# Patient Record
Sex: Male | Born: 1945 | Race: White | Hispanic: No | Marital: Married | Smoking: Never smoker
Health system: Southern US, Community
[De-identification: ages and names within clinical notes are randomized; demographics above are authoritative.]

## PROBLEM LIST (undated history)

## (undated) DIAGNOSIS — R42 Dizziness and giddiness: Secondary | ICD-10-CM

## (undated) DIAGNOSIS — E079 Disorder of thyroid, unspecified: Secondary | ICD-10-CM

## (undated) DIAGNOSIS — E119 Type 2 diabetes mellitus without complications: Secondary | ICD-10-CM

## (undated) DIAGNOSIS — I10 Essential (primary) hypertension: Secondary | ICD-10-CM

## (undated) HISTORY — PX: ANKLE SURGERY: SHX546

---

## 1999-01-09 ENCOUNTER — Encounter: Admission: RE | Admit: 1999-01-09 | Discharge: 1999-04-09 | Payer: Self-pay | Admitting: Endocrinology

## 2020-03-07 ENCOUNTER — Encounter (HOSPITAL_BASED_OUTPATIENT_CLINIC_OR_DEPARTMENT_OTHER): Payer: Self-pay | Admitting: *Deleted

## 2020-03-07 ENCOUNTER — Emergency Department (HOSPITAL_BASED_OUTPATIENT_CLINIC_OR_DEPARTMENT_OTHER)
Admission: EM | Admit: 2020-03-07 | Discharge: 2020-03-07 | Disposition: A | Discharge status: Home / Self Care | Payer: Medicare Other | Attending: Emergency Medicine | Admitting: Emergency Medicine

## 2020-03-07 ENCOUNTER — Other Ambulatory Visit: Payer: Self-pay

## 2020-03-07 ENCOUNTER — Emergency Department (HOSPITAL_BASED_OUTPATIENT_CLINIC_OR_DEPARTMENT_OTHER): Payer: Medicare Other

## 2020-03-07 DIAGNOSIS — E119 Type 2 diabetes mellitus without complications: Secondary | ICD-10-CM | POA: Insufficient documentation

## 2020-03-07 DIAGNOSIS — E860 Dehydration: Secondary | ICD-10-CM | POA: Diagnosis not present

## 2020-03-07 DIAGNOSIS — I10 Essential (primary) hypertension: Secondary | ICD-10-CM | POA: Diagnosis not present

## 2020-03-07 DIAGNOSIS — R531 Weakness: Secondary | ICD-10-CM | POA: Insufficient documentation

## 2020-03-07 HISTORY — DX: Essential (primary) hypertension: I10

## 2020-03-07 HISTORY — DX: Dizziness and giddiness: R42

## 2020-03-07 HISTORY — DX: Type 2 diabetes mellitus without complications: E11.9

## 2020-03-07 HISTORY — DX: Disorder of thyroid, unspecified: E07.9

## 2020-03-07 LAB — COMPREHENSIVE METABOLIC PANEL
ALT: 38 U/L (ref 0–44)
AST: 37 U/L (ref 15–41)
Albumin: 3.7 g/dL (ref 3.5–5.0)
Alkaline Phosphatase: 60 U/L (ref 38–126)
Anion gap: 11 (ref 5–15)
BUN: 24 mg/dL — ABNORMAL HIGH (ref 8–23)
CO2: 22 mmol/L (ref 22–32)
Calcium: 9 mg/dL (ref 8.9–10.3)
Chloride: 100 mmol/L (ref 98–111)
Creatinine, Ser: 2.07 mg/dL — ABNORMAL HIGH (ref 0.61–1.24)
GFR calc Af Amer: 35 mL/min — ABNORMAL LOW (ref >60)
GFR calc non Af Amer: 30 mL/min — ABNORMAL LOW (ref >60)
Glucose, Bld: 199 mg/dL — ABNORMAL HIGH (ref 70–99)
Potassium: 4.5 mmol/L (ref 3.5–5.1)
Sodium: 133 mmol/L — ABNORMAL LOW (ref 135–145)
Total Bilirubin: 0.7 mg/dL (ref 0.3–1.2)
Total Protein: 7 g/dL (ref 6.5–8.1)

## 2020-03-07 LAB — CBC WITH DIFFERENTIAL/PLATELET
Abs Immature Granulocytes: 0.01 10*3/uL (ref 0.00–0.07)
Basophils Absolute: 0 10*3/uL (ref 0.0–0.1)
Basophils Relative: 1 %
Eosinophils Absolute: 0.3 10*3/uL (ref 0.0–0.5)
Eosinophils Relative: 4 %
HCT: 35.2 % — ABNORMAL LOW (ref 39.0–52.0)
Hemoglobin: 11.5 g/dL — ABNORMAL LOW (ref 13.0–17.0)
Immature Granulocytes: 0 %
Lymphocytes Relative: 36 %
Lymphs Abs: 2.6 10*3/uL (ref 0.7–4.0)
MCH: 30.8 pg (ref 26.0–34.0)
MCHC: 32.7 g/dL (ref 30.0–36.0)
MCV: 94.4 fL (ref 80.0–100.0)
Monocytes Absolute: 0.6 10*3/uL (ref 0.1–1.0)
Monocytes Relative: 8 %
Neutro Abs: 3.7 10*3/uL (ref 1.7–7.7)
Neutrophils Relative %: 51 %
Platelets: 214 10*3/uL (ref 150–400)
RBC: 3.73 MIL/uL — ABNORMAL LOW (ref 4.22–5.81)
RDW: 12.8 % (ref 11.5–15.5)
WBC: 7.3 10*3/uL (ref 4.0–10.5)
nRBC: 0 % (ref 0.0–0.2)

## 2020-03-07 LAB — URINALYSIS, MICROSCOPIC (REFLEX)

## 2020-03-07 LAB — URINALYSIS, ROUTINE W REFLEX MICROSCOPIC
Bilirubin Urine: NEGATIVE
Glucose, UA: NEGATIVE mg/dL
Hgb urine dipstick: NEGATIVE
Ketones, ur: 15 mg/dL — AB
Nitrite: NEGATIVE
Protein, ur: NEGATIVE mg/dL
Specific Gravity, Urine: >1.03 — ABNORMAL HIGH (ref 1.005–1.030)
pH: 5.5 (ref 5.0–8.0)

## 2020-03-07 LAB — LIPASE, BLOOD: Lipase: 24 U/L (ref 11–51)

## 2020-03-07 LAB — CBG MONITORING, ED: Glucose-Capillary: 191 mg/dL — ABNORMAL HIGH (ref 70–99)

## 2020-03-07 MED ORDER — SODIUM CHLORIDE 0.9 % IV BOLUS
500.0000 mL | Freq: Once | INTRAVENOUS | Status: AC
  Administered 2020-03-07: 500 mL via INTRAVENOUS

## 2020-03-07 NOTE — ED Triage Notes (Signed)
Dizziness, diarrhea, fever, weakness. States he had a Covid vaccine a month ago.

## 2020-03-07 NOTE — Discharge Instructions (Addendum)
You were seen in the emergency department today with mild dehydration and elevated kidney function labs.  Your creatinine today was 2.07.  This may be from dehydration but you need close follow-up with your primary care doctor.  They need to repeat this chemistry/creatinine lab in the next 48 hours to make sure it has improved.  Please drink plenty of hydrating fluids such as water or electrolyte drinks that are low in sugar.  Avoid coffee and soda.  I will also have you temporarily stop taking your lisinopril as this can cause kidney injury.  Please make your primary doctor aware that we are stopping this medicine temporarily and decide if they want to start you back on it or select an alternative medicine for your blood pressure.   If you stop urinating, develop pain, worsening diarrhea, weakness, lightheadedness you should return to the emergency department immediately.

## 2020-03-07 NOTE — ED Provider Notes (Signed)
Emergency Department Provider Note   I have reviewed the triage vital signs and the nursing notes.   HISTORY  Chief Complaint Weakness   HPI Jay Baldwin is a 74 y.o. male with past medical history reviewed below presents to the emergency department with acute onset diarrhea symptoms this morning.  He has had some associated generalized weakness over the past 2 weeks.  He describes this as feeling lightheaded intermittently especially with standing.  Denies any chest pain, palpitations, shortness of breath.  He had an episode this morning where he took his home medications and went to work.  He suddenly had the sense of needing to have immediate diarrhea without significant pain.  He felt some abdominal cramping.  He was able to have diarrhea and immediately felt better.  He denies any blood in his bowel movements.  His bowel movements are consistently dark as he is on iron supplementation.  He reports occasional dysuria but nothing consistent.  No fevers or chills.  No active abdominal pain or back pain.  No radiation of symptoms or other modifying factors.   Past Medical History:  Diagnosis Date  . Diabetes mellitus without complication (HCC)   . Dizziness   . Hypertension   . Thyroid disease     There are no problems to display for this patient.   Past Surgical History:  Procedure Laterality Date  . ANKLE SURGERY      Allergies Patient has no known allergies.  No family history on file.  Social History Social History   Tobacco Use  . Smoking status: Never Smoker  . Smokeless tobacco: Never Used  Substance Use Topics  . Alcohol use: Never  . Drug use: Never    Review of Systems  Constitutional: No fever/chills. Positive intermittent dizziness.  Eyes: No visual changes. ENT: No sore throat. Cardiovascular: Denies chest pain. Respiratory: Denies shortness of breath. Gastrointestinal: No abdominal pain.  No nausea, no vomiting. Positive diarrhea this  AM x 1.  No constipation. Genitourinary: Negative for dysuria. Musculoskeletal: Negative for back pain. Skin: Negative for rash. Neurological: Negative for headaches, focal weakness or numbness.  10-point ROS otherwise negative.  ____________________________________________   PHYSICAL EXAM:  VITAL SIGNS: ED Triage Vitals  Enc Vitals Group     BP 03/07/20 1709 (!) 111/93     Pulse Rate 03/07/20 1709 61     Resp 03/07/20 1709 20     Temp 03/07/20 1709 98 F (36.7 C)     Temp Source 03/07/20 1709 Oral     SpO2 03/07/20 1709 99 %     Weight 03/07/20 1703 200 lb (90.7 kg)     Height 03/07/20 1703 6' (1.829 m)   Constitutional: Alert and oriented. Well appearing and in no acute distress. Eyes: Conjunctivae are normal.  Head: Atraumatic. Nose: No congestion/rhinnorhea. Mouth/Throat: Mucous membranes are moist.  Neck: No stridor.  Cardiovascular: Normal rate, regular rhythm. Good peripheral circulation. Grossly normal heart sounds.   Respiratory: Normal respiratory effort.  No retractions. Lungs CTAB. Gastrointestinal: Soft and nontender. No distention.  Musculoskeletal: No lower extremity tenderness nor edema. No gross deformities of extremities. Neurologic:  Normal speech and language. No gross focal neurologic deficits are appreciated.  Skin:  Skin is warm, dry and intact. No rash noted.  ____________________________________________   LABS (all labs ordered are listed, but only abnormal results are displayed)  Labs Reviewed  COMPREHENSIVE METABOLIC PANEL - Abnormal; Notable for the following components:      Result Value  Sodium 133 (*)    Glucose, Bld 199 (*)    BUN 24 (*)    Creatinine, Ser 2.07 (*)    GFR calc non Af Amer 30 (*)    GFR calc Af Amer 35 (*)    All other components within normal limits  CBC WITH DIFFERENTIAL/PLATELET - Abnormal; Notable for the following components:   RBC 3.73 (*)    Hemoglobin 11.5 (*)    HCT 35.2 (*)    All other components  within normal limits  URINALYSIS, ROUTINE W REFLEX MICROSCOPIC - Abnormal; Notable for the following components:   Specific Gravity, Urine >1.030 (*)    Ketones, ur 15 (*)    Leukocytes,Ua TRACE (*)    All other components within normal limits  URINALYSIS, MICROSCOPIC (REFLEX) - Abnormal; Notable for the following components:   Bacteria, UA FEW (*)    All other components within normal limits  CBG MONITORING, ED - Abnormal; Notable for the following components:   Glucose-Capillary 191 (*)    All other components within normal limits  LIPASE, BLOOD   ____________________________________________  EKG   EKG Interpretation  Date/Time:  Tuesday March 07 2020 17:22:53 EDT Ventricular Rate:  61 PR Interval:    QRS Duration: 103 QT Interval:  418 QTC Calculation: 421 R Axis:   -12 Text Interpretation: Sinus rhythm Nonspecific T abnormalities, inferior leads No STEMI Confirmed by Nanda Quinton [phone]) on 03/07/2020 5:31:41 PM       ____________________________________________  RADIOLOGY  CT head reviewed with no acute findings.  ____________________________________________   PROCEDURES  Procedure(s) performed:   Procedures  None ____________________________________________   INITIAL IMPRESSION / ASSESSMENT AND PLAN / ED COURSE  Pertinent labs & imaging results that were available during my care of the patient were reviewed by me and considered in my medical decision making (see chart for details).   Patient presents to the emergency department with acute onset diarrhea with generalized weakness.  He is afebrile here.  There is mention of possible fever in his triage note but that is not accurate.  He appears reasonably well-hydrated.  His abdomen is completely soft and nontender to palpation.  His initial blood work shows no leukocytosis.  No evidence of urinary tract infection.    Chemistry with elevated creatinine which appears new. Discussed admit for IVF with patient  but AKI is borderline. He has a very close relationship with his PCP and can get labs repeated quickly. CT head ordered with dizzy feelings intermittently but no findings on exam to suspect CVA. CT head is normal for age.   Patient ambulatory in the ED without assistance. He is feeling improved after IVF. Agree with plan for repeat chemistry in 2 days after holding lisinopril and hydrating with fluid PO at home. Discussed strict return precautions with patient and wife at bedside.    ____________________________________________  FINAL CLINICAL IMPRESSION(S) / ED DIAGNOSES  Final diagnoses:  Generalized weakness  Dehydration    MEDICATIONS GIVEN DURING THIS VISIT:  Medications  sodium chloride 0.9 % bolus 500 mL (0 mLs Intravenous Stopped 03/07/20 1958)    Note:  This document was prepared using Dragon voice recognition software and may include unintentional dictation errors.  Nanda Quinton, MD, [facility] Emergency Medicine    Marky Buresh, [facility], MD 03/08/20 [phone]

## 2020-03-07 NOTE — ED Notes (Signed)
PT ambulated with steady gait and no assistance to the end of the nurses station. No complaints and O2 Sats 95% lowest, PT lied flat for orthostatic vitals.

## 2021-05-22 IMAGING — CT CT HEAD W/O CM
3 series · 15 of 47 positions shown, 18 images · non-contrast
Comparison: None.

CLINICAL DATA: Dizziness, diarrhea, fever and weakness.

EXAM:
CT HEAD WITHOUT CONTRAST
TECHNIQUE: Contiguous axial images were obtained from the base of the skull
through the vertex without intravenous contrast.

[Series 2: head wo · axial · 0.44mm/px · z∈[+910,+1036]mm · 9 of 31 slices shown, 12 images]
[im 3/31  brain]
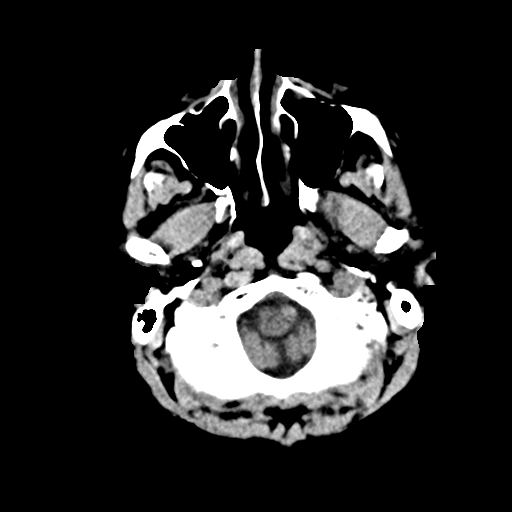
[im 3/31  bone]
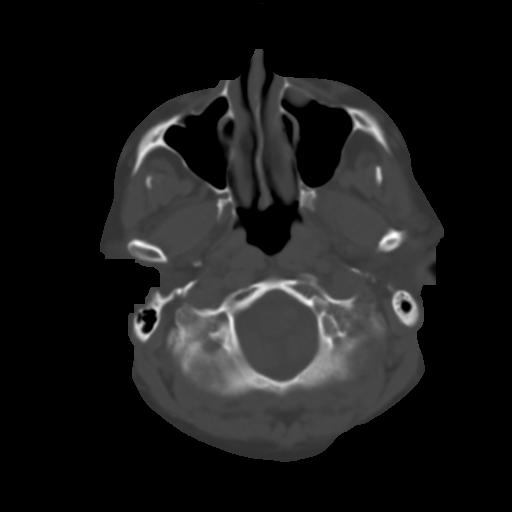
[im 6/31  brain]
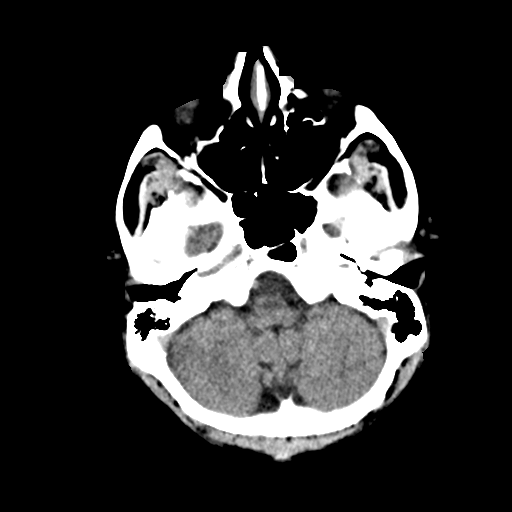
[im 9/31  brain]
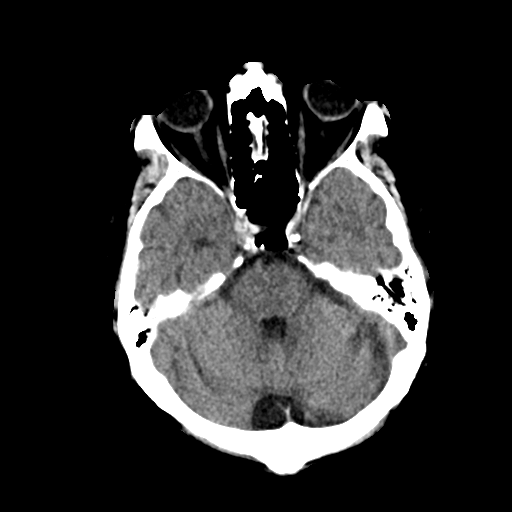
[im 12/31  brain]
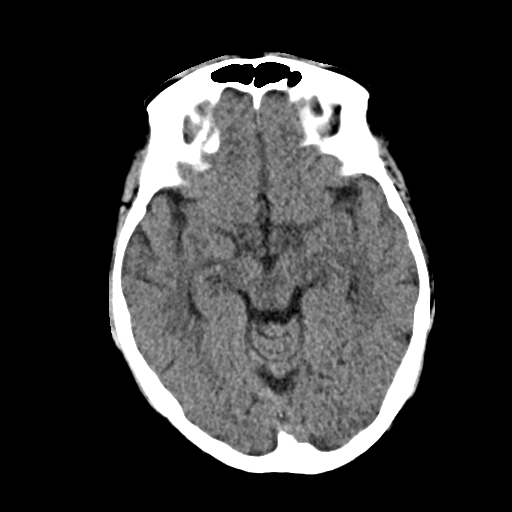
[im 16/31  brain]
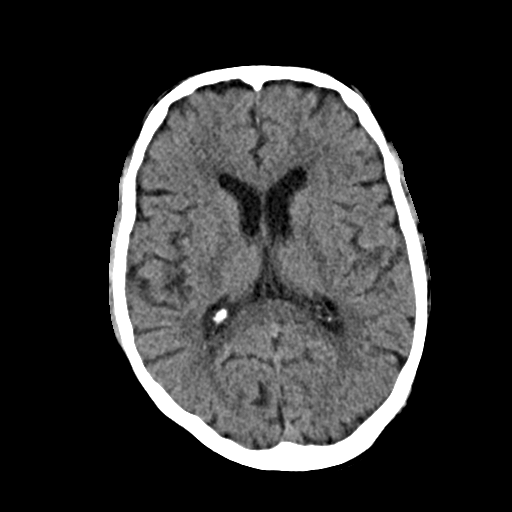
[im 16/31  bone]
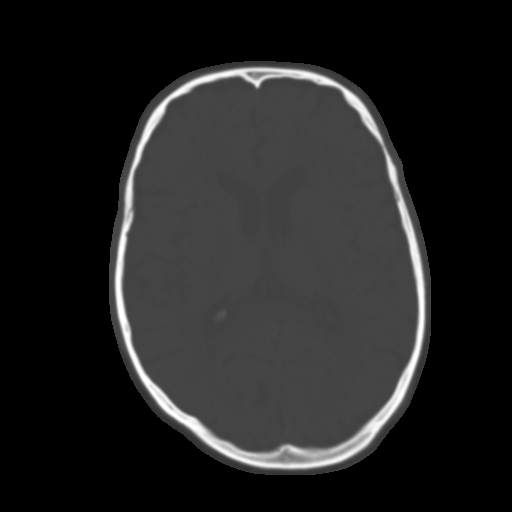
[im 19/31  brain]
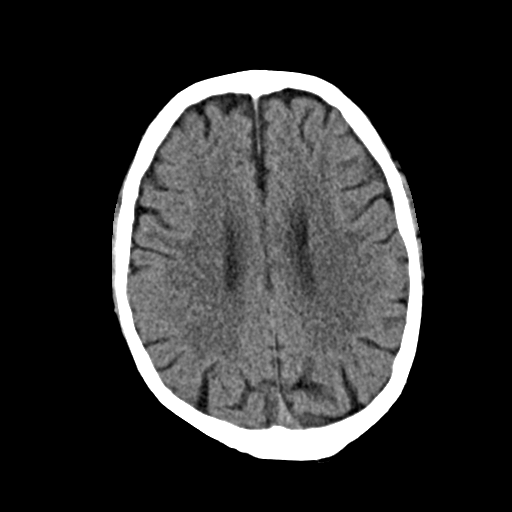
[im 22/31  brain]
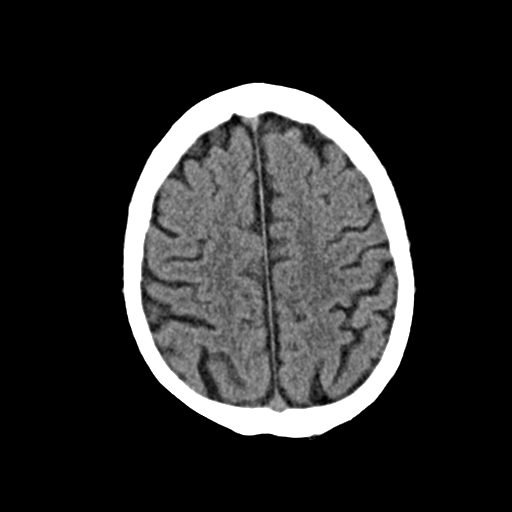
[im 25/31  brain]
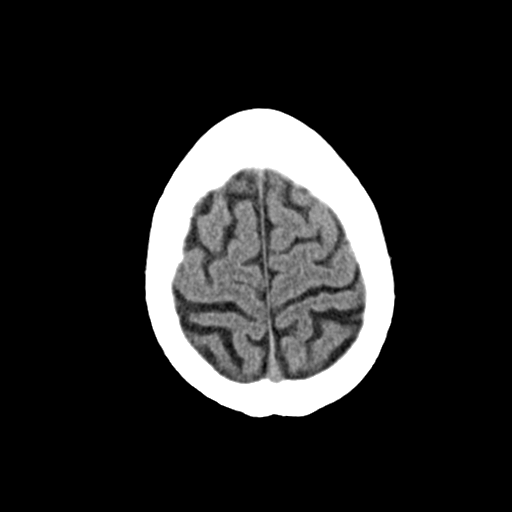
[im 28/31  brain]
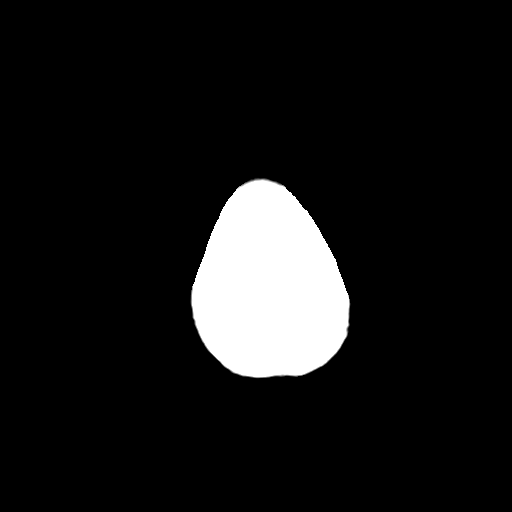
[im 28/31  bone]
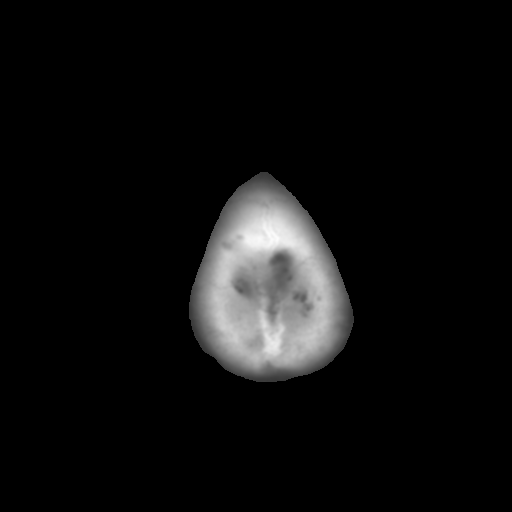

[Series 4: coronal soft · coronal · 0.31mm/px · 3 of 70 slices shown]
[im 24/70  brain]
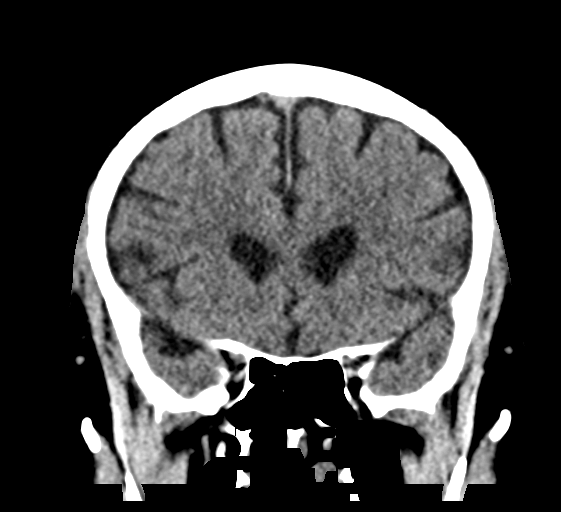
[im 31/70  brain]
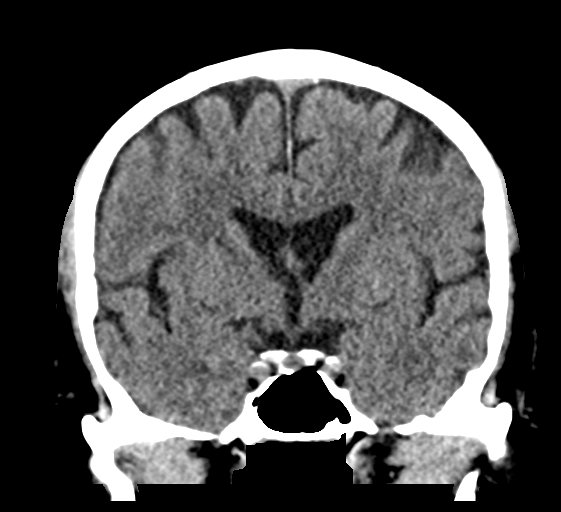
[im 39/70  brain]
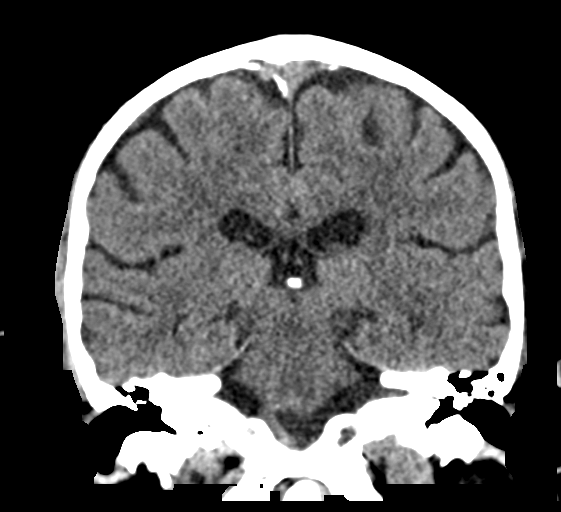

[Series 5: sag soft · sagittal · 0.29mm/px · 3 of 56 slices shown]
[im 19/56  brain]
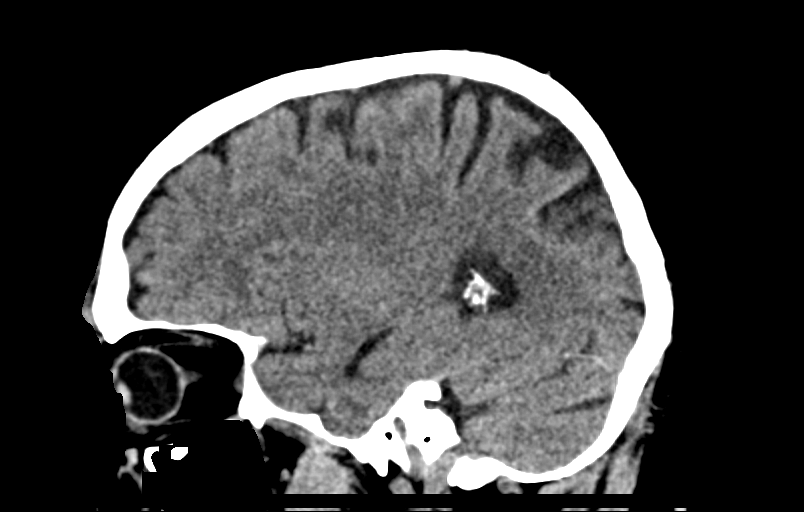
[im 28/56  brain]
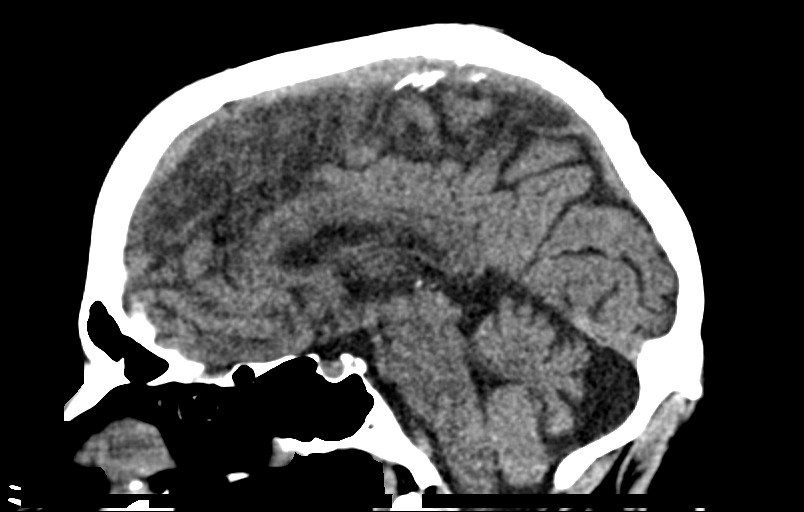
[im 37/56  brain]
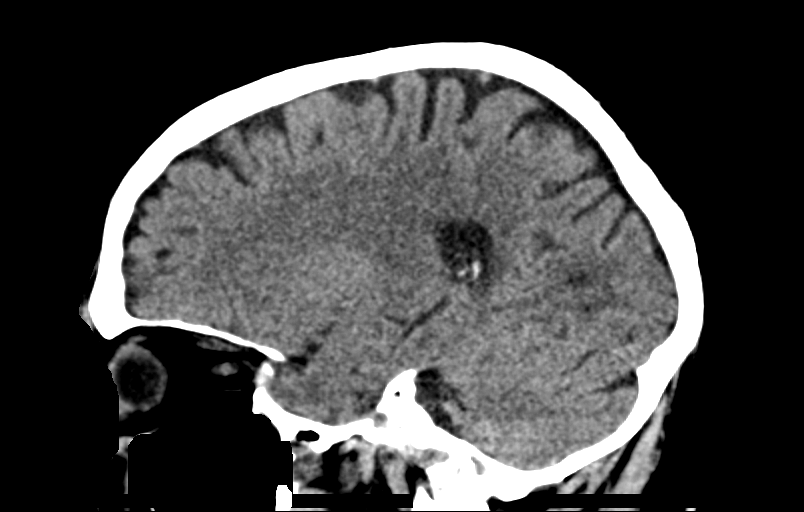

[15 of 47 positions shown; findings below may reference images not displayed]

FINDINGS: Brain: There is mild cerebral atrophy with widening of the
extra-axial spaces and ventricular dilatation.
There are areas of decreased attenuation within the white matter
tracts of the supratentorial brain, consistent with microvascular
disease changes.

Vascular: No hyperdense vessel or unexpected calcification.

Skull: Normal. Negative for fracture or focal lesion.

Sinuses/Orbits: A 0.7 cm anterior right maxillary sinus polyp versus
mucous retention cyst is seen. An additional 1.1 cm polyp versus
mucous retention cyst is seen within the anteromedial aspect of the
left maxillary sinus.

Other: None.
IMPRESSION: No acute intracranial pathology.

## 2023-11-29 ENCOUNTER — Encounter (HOSPITAL_BASED_OUTPATIENT_CLINIC_OR_DEPARTMENT_OTHER): Payer: Self-pay

## 2023-11-29 ENCOUNTER — Other Ambulatory Visit: Payer: Self-pay

## 2023-11-29 ENCOUNTER — Emergency Department (HOSPITAL_BASED_OUTPATIENT_CLINIC_OR_DEPARTMENT_OTHER)
Admission: EM | Admit: 2023-11-29 | Discharge: 2023-11-29 | Disposition: A | Discharge status: Home / Self Care | Payer: Medicare PPO | Attending: Emergency Medicine | Admitting: Emergency Medicine

## 2023-11-29 DIAGNOSIS — Z79899 Other long term (current) drug therapy: Secondary | ICD-10-CM | POA: Diagnosis not present

## 2023-11-29 DIAGNOSIS — E119 Type 2 diabetes mellitus without complications: Secondary | ICD-10-CM | POA: Diagnosis not present

## 2023-11-29 DIAGNOSIS — Z7982 Long term (current) use of aspirin: Secondary | ICD-10-CM | POA: Diagnosis not present

## 2023-11-29 DIAGNOSIS — R110 Nausea: Secondary | ICD-10-CM

## 2023-11-29 DIAGNOSIS — Z794 Long term (current) use of insulin: Secondary | ICD-10-CM | POA: Diagnosis not present

## 2023-11-29 DIAGNOSIS — I10 Essential (primary) hypertension: Secondary | ICD-10-CM | POA: Diagnosis not present

## 2023-11-29 DIAGNOSIS — R197 Diarrhea, unspecified: Secondary | ICD-10-CM

## 2023-11-29 DIAGNOSIS — Z7984 Long term (current) use of oral hypoglycemic drugs: Secondary | ICD-10-CM | POA: Diagnosis not present

## 2023-11-29 DIAGNOSIS — E860 Dehydration: Secondary | ICD-10-CM | POA: Diagnosis not present

## 2023-11-29 LAB — CBC
HCT: 35.7 % — ABNORMAL LOW (ref 39.0–52.0)
Hemoglobin: 11.9 g/dL — ABNORMAL LOW (ref 13.0–17.0)
MCH: 30.5 pg (ref 26.0–34.0)
MCHC: 33.3 g/dL (ref 30.0–36.0)
MCV: 91.5 fL (ref 80.0–100.0)
Platelets: 171 10*3/uL (ref 150–400)
RBC: 3.9 MIL/uL — ABNORMAL LOW (ref 4.22–5.81)
RDW: 12.8 % (ref 11.5–15.5)
WBC: 5.7 10*3/uL (ref 4.0–10.5)
nRBC: 0 % (ref 0.0–0.2)

## 2023-11-29 LAB — URINALYSIS, ROUTINE W REFLEX MICROSCOPIC
Bilirubin Urine: NEGATIVE
Glucose, UA: 100 mg/dL — AB
Hgb urine dipstick: NEGATIVE
Ketones, ur: NEGATIVE mg/dL
Leukocytes,Ua: NEGATIVE
Nitrite: NEGATIVE
Protein, ur: NEGATIVE mg/dL
Specific Gravity, Urine: 1.025 (ref 1.005–1.030)
pH: 5.5 (ref 5.0–8.0)

## 2023-11-29 LAB — COMPREHENSIVE METABOLIC PANEL
ALT: 23 U/L (ref 0–44)
AST: 26 U/L (ref 15–41)
Albumin: 3.5 g/dL (ref 3.5–5.0)
Alkaline Phosphatase: 53 U/L (ref 38–126)
Anion gap: 7 (ref 5–15)
BUN: 15 mg/dL (ref 8–23)
CO2: 23 mmol/L (ref 22–32)
Calcium: 9 mg/dL (ref 8.9–10.3)
Chloride: 104 mmol/L (ref 98–111)
Creatinine, Ser: 0.91 mg/dL (ref 0.61–1.24)
GFR, Estimated: >60 mL/min (ref >60)
Glucose, Bld: 173 mg/dL — ABNORMAL HIGH (ref 70–99)
Potassium: 4.3 mmol/L (ref 3.5–5.1)
Sodium: 134 mmol/L — ABNORMAL LOW (ref 135–145)
Total Bilirubin: 1.1 mg/dL (ref <1.2)
Total Protein: 6.7 g/dL (ref 6.5–8.1)

## 2023-11-29 LAB — LIPASE, BLOOD: Lipase: 24 U/L (ref 11–51)

## 2023-11-29 MED ORDER — ONDANSETRON 4 MG PO TBDP: 4.0000 mg | ORAL_TABLET | Freq: Three times a day (TID) | ORAL | 0 refills | Status: AC | PRN

## 2023-11-29 NOTE — ED Provider Notes (Signed)
[place] [facility] DEPARTMENT AT [facility] Provider Note   CSN: [id number] Arrival date & time: 11/29/23  0945     History  Chief Complaint  Patient presents with   Diarrhea    Jay Baldwin is a 77 y.o. male.  The history is provided by the patient, medical records and the spouse. No language interpreter was used.  Diarrhea Quality:  Watery Severity:  Moderate Onset quality:  Gradual Duration:  1 week Timing:  Constant Progression:  Unchanged Relieved by:  Nothing Worsened by:  Nothing Ineffective treatments:  None tried Associated symptoms: no abdominal pain, no chills, no recent cough, no diaphoresis, no fever, no headaches, no URI and no vomiting   Risk factors: no recent antibiotic use   Risk factors comment:  Started iron      Home Medications Prior to Admission medications   Medication Sig Start Date End Date Taking? Authorizing Provider  aspirin 81 MG EC tablet Take 1 tablet daily    [provider]  escitalopram (LEXAPRO) 20 MG tablet TAKE 1 TABLET BY MOUTH DAILY 02/02/14   [provider]  ezetimibe (ZETIA) 10 MG tablet Take by mouth. 07/14/19   [provider]  gabapentin (NEURONTIN) 300 MG capsule TAKE THREE (3) CAPSULES BY MOUTH TWO (2) TIMES DAILY 08/21/16   [provider]  glucose blood (ONETOUCH ULTRA) test strip USE ONE TEST STRIP TO CHECK BLOOD SUGAR THREE TIMES DAILY 02/22/19   [provider]  insulin NPH-regular Human (70-30) 100 UNIT/ML injection Inject into the skin. 04/13/18   [provider]  Insulin Syringe-Needle U-100 (B-D INS SYR ULTRAFINE 1CC/30G) 30G X 1/2" 1 ML MISC USE AS DIRECTED 12/27/19   [provider]  iron polysaccharides (NIFEREX) 150 MG capsule TAKE ONE CAPSULE BY MOUTH TWICE A DAY 02/16/20   [provider]  levothyroxine (SYNTHROID) 125 MCG tablet TAKE 1 TABLET BY MOUTH 6 DAYS WEEKLY, AND 1 AND 1/2 TABLETS BY MOUTH 1 DAY A WEEK AS DIRECTED  06/06/14   [provider]  lisinopril (ZESTRIL) 10 MG tablet TAKE 1 TABLET BY MOUTH DAILY 05/03/16   [provider]  metFORMIN (GLUCOPHAGE) 1000 MG tablet TAKE ONE (1) TABLET BY MOUTH TWO (2) TIMES DAILY 06/30/14   [provider]  OneTouch Delica Lancets 33G MISC Use to check blood sugar four times daily. Dx: E11.65 04/19/19   [provider]  OneTouch Delica Lancets 33G MISC Use to check blood sugar twice daily. Dx: E11.65 02/09/18   [provider]  rosuvastatin (CRESTOR) 40 MG tablet Take by mouth. 01/24/20   [provider]  triamcinolone cream (KENALOG) 0.1 % APPLY TO AFFECTED AREA TWICE DAILY FOR 2 WEEKS 11/18/19   [provider]      Allergies    Patient has no known allergies.    Review of Systems   Review of Systems  Constitutional:  Positive for fatigue. Negative for chills, diaphoresis and fever.  HENT:  Negative for congestion.   Respiratory:  Negative for cough, chest tightness, shortness of breath and wheezing.   Cardiovascular:  Negative for chest pain.  Gastrointestinal:  Positive for diarrhea and nausea. Negative for abdominal distention, abdominal pain, constipation and vomiting.  Genitourinary:  Positive for dysuria. Negative for flank pain.  Musculoskeletal:  Negative for back pain, neck pain and neck stiffness.  Skin:  Negative for rash and wound.  Neurological:  Negative for dizziness, weakness, light-headedness and headaches.  Psychiatric/Behavioral:  Negative for agitation and confusion.  All other systems reviewed and are negative.   Physical Exam Updated Vital Signs BP (!) 142/57 (BP Location: Left Arm)   Pulse (!) 57   Temp 97.9 F (36.6 C) (Oral)   Resp 18   Ht 6' (1.829 m)   Wt 86.2 kg   SpO2 98%   BMI 25.77 kg/m  Physical Exam Vitals and nursing note reviewed.  Constitutional:      General: He is not in acute distress.    Appearance: He is well-developed. He is not ill-appearing,  toxic-appearing or diaphoretic.  HENT:     Head: Normocephalic and atraumatic.     Nose: No congestion or rhinorrhea.     Mouth/Throat:     Mouth: Mucous membranes are dry.     Pharynx: No oropharyngeal exudate or posterior oropharyngeal erythema.  Eyes:     Extraocular Movements: Extraocular movements intact.     Conjunctiva/sclera: Conjunctivae normal.     Pupils: Pupils are equal, round, and reactive to light.  Cardiovascular:     Rate and Rhythm: Regular rhythm. Tachycardia present.     Heart sounds: No murmur heard. Pulmonary:     Effort: Pulmonary effort is normal. No respiratory distress.     Breath sounds: Normal breath sounds. No wheezing, rhonchi or rales.  Chest:     Chest wall: No tenderness.  Abdominal:     General: There is no distension.     Palpations: Abdomen is soft.     Tenderness: There is no abdominal tenderness. There is no guarding or rebound.  Musculoskeletal:        General: No swelling or tenderness.     Cervical back: Neck supple.     Right lower leg: No edema.     Left lower leg: No edema.  Skin:    General: Skin is warm and dry.     Capillary Refill: Capillary refill takes less than 2 seconds.     Findings: No erythema or rash.  Neurological:     General: No focal deficit present.     Mental Status: He is alert.     Sensory: No sensory deficit.     Motor: No weakness.  Psychiatric:        Mood and Affect: Mood normal.     ED Results / Procedures / Treatments   Labs (all labs ordered are listed, but only abnormal results are displayed) Labs Reviewed  COMPREHENSIVE METABOLIC PANEL - Abnormal; Notable for the following components:      Result Value   Sodium 134 (*)    Glucose, Bld 173 (*)    All other components within normal limits  CBC - Abnormal; Notable for the following components:   RBC 3.90 (*)    Hemoglobin 11.9 (*)    HCT 35.7 (*)    All other components within normal limits  URINALYSIS, ROUTINE W REFLEX MICROSCOPIC -  Abnormal; Notable for the following components:   APPearance CLOUDY (*)    Glucose, UA 100 (*)    All other components within normal limits  URINE CULTURE  LIPASE, BLOOD    EKG None  Radiology No results found.  Procedures Procedures    Medications Ordered in ED Medications - No data to display  ED Course/ Medical Decision Making/ A&P                                 Medical Decision Making Amount and/or Complexity of  Data Reviewed Labs: ordered.  Risk Prescription drug management.    Jay Baldwin is a 77 y.o. male with a past medical history significant for hypertension, diabetes, and thyroid disease who presents with just over a week of diarrhea.  Patient reports he saw his doctor the other day and had stool testing that had not seen the results.  He reports he is still having waxing and waning diarrhea daily and feels like he is getting somewhat dehydrated.  He is also had some nausea but no vomiting.  Denies chest pain or abdominal pain at this time.  Denies any fevers or chills and denies any sick contacts.  He does report that just before his diarrhea began he started taking a new iron supplementation and had not realized that that could have caused his symptoms.  He denies any rectal bleeding to me.  He denies any trauma.  Denies any hematuria.  He does report some mild dysuria.  Denies other complaints on arrival.  On exam, lungs clear.  Chest nontender.  Abdomen nontender.  Very active bowel sounds.  Back and flanks nontender.  Slightly dry mucous membranes but does not appear pale.  No focal neurologic deficits.  Patient resting comfortably.  I was able to review Care Everywhere and saw that his C. difficile testing was negative as was his stool pathogen panel.  Have less suspicion for infectious etiology although could be a viral GI bug not on the panel.  His labs showed no critical abnormalities and only mild electrolyte disturbance.  His vital signs did not  show hypotension or tachycardia on my evaluation and clinically I do not think patient needs IV fluids at this time.  Patient agrees.  Due to the nausea will give prescription for some nausea medicine.  I suspect that his new iron supplementation may be causing some of his diarrheal symptoms.  Patient will trial holding off on the iron for several days and calling his primary doctor for follow-up.  Given his lack of any tenderness I have low suspicion for a diverticulitis or other intra-abdominal pathology that require CT scan.  Will hold off on imaging.  Patient and family agree with plan of care and he will increase his hydration, use the nausea medicine, hold on the iron, and talk to his doctor.  He understood return precautions and follow-up instructions and was discharged in good condition.                 Final Clinical Impression(s) / ED Diagnoses Final diagnoses:  Nausea  Diarrhea, unspecified type  Dehydration    Rx / DC Orders ED Discharge Orders          Ordered    ondansetron (ZOFRAN-ODT) 4 MG disintegrating tablet  Every 8 hours PRN        11/29/23 1240            Clinical Impression: 1. Nausea   2. Diarrhea, unspecified type   3. Dehydration     Disposition: Discharge  Condition: Good  I have discussed the results, Dx and Tx plan with the pt(& family if present). He/she/they expressed understanding and agree(s) with the plan. Discharge instructions discussed at great length. Strict return precautions discussed and pt &/or family have verbalized understanding of the instructions. No further questions at time of discharge.    New Prescriptions   ONDANSETRON (ZOFRAN-ODT) 4 MG DISINTEGRATING TABLET    Take 1 tablet (4 mg total) by mouth every 8 (eight) hours as needed  for nausea or vomiting.    Follow Up: Andreas Blower., MD [address] [place] [address] [phone]     [facility] Emergency Department at [facility] [address] [place] [address] [phone]        Kayla Deshaies, Canary Brim, MD 11/29/23 1255

## 2023-11-29 NOTE — Discharge Instructions (Signed)
Your history, exam, and evaluation today are consistent with some mild dehydration related to all the diarrhea and nausea you had recently.  Given your description of symptoms and timeline, I suspect it may be related to some of the new iron supplementation you started taking right before the diarrhea began.  We recommend holding it for the next few days to see if this helps and use the nausea medicine to crease hydration.  Please also call to follow-up with your primary doctor.  Given the burning when you urinated, a culture was sent.  If any symptoms change or worsen acutely, please return to the nearest emergency department.

## 2023-11-29 NOTE — ED Triage Notes (Signed)
The patient having diarrhea for one week. He stated he was seen at his PCP and they sent a stool sample but does not have his results. He stated the imodium is not helping.

## 2023-12-01 LAB — URINE CULTURE: Culture: 10000 — AB
